# Patient Record
Sex: Female | Born: 1970 | Race: White | Hispanic: No | Marital: Married | Smoking: Never smoker
Health system: Southern US, Community
[De-identification: ages and names within clinical notes are randomized; demographics above are authoritative.]

## PROBLEM LIST (undated history)

## (undated) DIAGNOSIS — G8929 Other chronic pain: Secondary | ICD-10-CM

## (undated) DIAGNOSIS — M549 Dorsalgia, unspecified: Secondary | ICD-10-CM

## (undated) HISTORY — PX: BACK SURGERY: SHX140

## (undated) HISTORY — PX: OTHER SURGICAL HISTORY: SHX169

## (undated) HISTORY — PX: ANKLE FRACTURE SURGERY: SHX122

---

## 2016-05-02 ENCOUNTER — Other Ambulatory Visit (HOSPITAL_COMMUNITY): Payer: Self-pay | Admitting: Nurse Practitioner

## 2016-05-02 DIAGNOSIS — R1011 Right upper quadrant pain: Secondary | ICD-10-CM

## 2016-05-03 ENCOUNTER — Observation Stay (HOSPITAL_COMMUNITY)
Admission: EM | Admit: 2016-05-03 | Discharge: 2016-05-04 | Disposition: A | Discharge status: Home / Self Care | Payer: Managed Care, Other (non HMO) | Attending: Emergency Medicine | Admitting: Emergency Medicine

## 2016-05-03 ENCOUNTER — Encounter (HOSPITAL_COMMUNITY)
Admission: EM | Disposition: A | Discharge status: Home / Self Care | Payer: Self-pay | Source: Home / Self Care | Attending: Emergency Medicine

## 2016-05-03 ENCOUNTER — Observation Stay (HOSPITAL_COMMUNITY): Payer: Managed Care, Other (non HMO)

## 2016-05-03 ENCOUNTER — Observation Stay (HOSPITAL_COMMUNITY): Payer: Managed Care, Other (non HMO) | Admitting: Anesthesiology

## 2016-05-03 ENCOUNTER — Ambulatory Visit (HOSPITAL_COMMUNITY)
Admission: RE | Admit: 2016-05-03 | Discharge: 2016-05-03 | Disposition: A | Discharge status: Home / Self Care | Payer: Managed Care, Other (non HMO) | Source: Ambulatory Visit | Attending: Nurse Practitioner | Admitting: Nurse Practitioner

## 2016-05-03 ENCOUNTER — Encounter (HOSPITAL_COMMUNITY): Payer: Self-pay

## 2016-05-03 DIAGNOSIS — Z880 Allergy status to penicillin: Secondary | ICD-10-CM | POA: Diagnosis not present

## 2016-05-03 DIAGNOSIS — K8000 Calculus of gallbladder with acute cholecystitis without obstruction: Secondary | ICD-10-CM | POA: Diagnosis present

## 2016-05-03 DIAGNOSIS — Z419 Encounter for procedure for purposes other than remedying health state, unspecified: Secondary | ICD-10-CM

## 2016-05-03 DIAGNOSIS — K8012 Calculus of gallbladder with acute and chronic cholecystitis without obstruction: Principal | ICD-10-CM | POA: Insufficient documentation

## 2016-05-03 DIAGNOSIS — R1011 Right upper quadrant pain: Secondary | ICD-10-CM

## 2016-05-03 DIAGNOSIS — G8929 Other chronic pain: Secondary | ICD-10-CM | POA: Insufficient documentation

## 2016-05-03 DIAGNOSIS — M549 Dorsalgia, unspecified: Secondary | ICD-10-CM | POA: Diagnosis not present

## 2016-05-03 DIAGNOSIS — K810 Acute cholecystitis: Secondary | ICD-10-CM | POA: Diagnosis present

## 2016-05-03 HISTORY — DX: Dorsalgia, unspecified: M54.9

## 2016-05-03 HISTORY — PX: CHOLECYSTECTOMY: SHX55

## 2016-05-03 HISTORY — DX: Other chronic pain: G89.29

## 2016-05-03 LAB — I-STAT BETA HCG BLOOD, ED (MC, WL, AP ONLY)

## 2016-05-03 LAB — COMPREHENSIVE METABOLIC PANEL
ALK PHOS: 63 U/L (ref 38–126)
ALT: 14 U/L (ref 14–54)
ANION GAP: 7 (ref 5–15)
AST: 16 U/L (ref 15–41)
Albumin: 3.8 g/dL (ref 3.5–5.0)
BILIRUBIN TOTAL: 0.6 mg/dL (ref 0.3–1.2)
BUN: 12 mg/dL (ref 6–20)
CALCIUM: 8.9 mg/dL (ref 8.9–10.3)
CO2: 26 mmol/L (ref 22–32)
CREATININE: 0.88 mg/dL (ref 0.44–1.00)
Chloride: 105 mmol/L (ref 101–111)
GFR calc non Af Amer: >60 mL/min (ref >60)
Glucose, Bld: 100 mg/dL — ABNORMAL HIGH (ref 65–99)
Potassium: 3.9 mmol/L (ref 3.5–5.1)
SODIUM: 138 mmol/L (ref 135–145)
TOTAL PROTEIN: 7.3 g/dL (ref 6.5–8.1)

## 2016-05-03 LAB — CBC
HCT: 37.3 % (ref 36.0–46.0)
HEMOGLOBIN: 12.7 g/dL (ref 12.0–15.0)
MCH: 32 pg (ref 26.0–34.0)
MCHC: 34 g/dL (ref 30.0–36.0)
MCV: 94 fL (ref 78.0–100.0)
PLATELETS: 349 10*3/uL (ref 150–400)
RBC: 3.97 MIL/uL (ref 3.87–5.11)
RDW: 12.6 % (ref 11.5–15.5)
WBC: 8.8 10*3/uL (ref 4.0–10.5)

## 2016-05-03 LAB — URINALYSIS, ROUTINE W REFLEX MICROSCOPIC
Bilirubin Urine: NEGATIVE
Glucose, UA: NEGATIVE mg/dL
HGB URINE DIPSTICK: NEGATIVE
Ketones, ur: NEGATIVE mg/dL
Leukocytes, UA: NEGATIVE
NITRITE: NEGATIVE
PROTEIN: NEGATIVE mg/dL
Specific Gravity, Urine: 1.027 (ref 1.005–1.030)
pH: 5 (ref 5.0–8.0)

## 2016-05-03 LAB — LIPASE, BLOOD: Lipase: 18 U/L (ref 11–51)

## 2016-05-03 SURGERY — LAPAROSCOPIC CHOLECYSTECTOMY WITH INTRAOPERATIVE CHOLANGIOGRAM
Anesthesia: General

## 2016-05-03 MED ORDER — DIPHENHYDRAMINE HCL 50 MG/ML IJ SOLN: 25.0000 mg | Freq: Four times a day (QID) | INTRAMUSCULAR | Status: DC | PRN

## 2016-05-03 MED ORDER — IOPAMIDOL (ISOVUE-300) INJECTION 61%
INTRAVENOUS | Status: AC
  Filled 2016-05-03: qty 50

## 2016-05-03 MED ORDER — PHENYLEPHRINE 40 MCG/ML (10ML) SYRINGE FOR IV PUSH (FOR BLOOD PRESSURE SUPPORT)
PREFILLED_SYRINGE | INTRAVENOUS | Status: DC | PRN
  Administered 2016-05-03: 80 ug via INTRAVENOUS
  Administered 2016-05-03 (×2): 40 ug via INTRAVENOUS

## 2016-05-03 MED ORDER — CIPROFLOXACIN IN D5W 400 MG/200ML IV SOLN
400.0000 mg | Freq: Two times a day (BID) | INTRAVENOUS | Status: DC
  Administered 2016-05-03: 400 mg via INTRAVENOUS
  Filled 2016-05-03: qty 200

## 2016-05-03 MED ORDER — OXYCODONE HCL 5 MG PO TABS: 5.0000 mg | ORAL_TABLET | Freq: Once | ORAL | Status: DC | PRN

## 2016-05-03 MED ORDER — MORPHINE SULFATE (PF) 4 MG/ML IV SOLN: 1.0000 mg | INTRAVENOUS | Status: DC | PRN

## 2016-05-03 MED ORDER — SUGAMMADEX SODIUM 200 MG/2ML IV SOLN
INTRAVENOUS | Status: AC
  Filled 2016-05-03: qty 2

## 2016-05-03 MED ORDER — HYDROMORPHONE HCL 1 MG/ML IJ SOLN: 1.0000 mg | INTRAMUSCULAR | Status: DC | PRN

## 2016-05-03 MED ORDER — BUPIVACAINE-EPINEPHRINE 0.5% -1:200000 IJ SOLN
INTRAMUSCULAR | Status: DC | PRN
  Administered 2016-05-03: 20 mL

## 2016-05-03 MED ORDER — HYDROMORPHONE HCL 2 MG/ML IJ SOLN
INTRAMUSCULAR | Status: AC
  Filled 2016-05-03: qty 1

## 2016-05-03 MED ORDER — LACTATED RINGERS IR SOLN
Status: DC | PRN
  Administered 2016-05-03: 1000 mL

## 2016-05-03 MED ORDER — KCL IN DEXTROSE-NACL 20-5-0.45 MEQ/L-%-% IV SOLN
INTRAVENOUS | Status: AC
  Filled 2016-05-03: qty 1000

## 2016-05-03 MED ORDER — BUPIVACAINE-EPINEPHRINE (PF) 0.5% -1:200000 IJ SOLN
INTRAMUSCULAR | Status: AC
  Filled 2016-05-03: qty 30

## 2016-05-03 MED ORDER — VENLAFAXINE HCL ER 150 MG PO CP24
150.0000 mg | ORAL_CAPSULE | Freq: Every day | ORAL | Status: DC
  Administered 2016-05-03: 150 mg via ORAL
  Filled 2016-05-03: qty 1

## 2016-05-03 MED ORDER — FENTANYL CITRATE (PF) 250 MCG/5ML IJ SOLN
INTRAMUSCULAR | Status: AC
  Filled 2016-05-03: qty 5

## 2016-05-03 MED ORDER — ONDANSETRON HCL 4 MG/2ML IJ SOLN
INTRAMUSCULAR | Status: AC
  Filled 2016-05-03: qty 2

## 2016-05-03 MED ORDER — SUGAMMADEX SODIUM 200 MG/2ML IV SOLN
INTRAVENOUS | Status: DC | PRN
  Administered 2016-05-03: 200 mg via INTRAVENOUS

## 2016-05-03 MED ORDER — PROPOFOL 10 MG/ML IV BOLUS
INTRAVENOUS | Status: AC
  Filled 2016-05-03: qty 20

## 2016-05-03 MED ORDER — DEXAMETHASONE SODIUM PHOSPHATE 10 MG/ML IJ SOLN
INTRAMUSCULAR | Status: DC | PRN
  Administered 2016-05-03: 10 mg via INTRAVENOUS

## 2016-05-03 MED ORDER — DEXAMETHASONE SODIUM PHOSPHATE 10 MG/ML IJ SOLN
INTRAMUSCULAR | Status: AC
  Filled 2016-05-03: qty 1

## 2016-05-03 MED ORDER — HYDROMORPHONE HCL 1 MG/ML IJ SOLN: 0.2500 mg | INTRAMUSCULAR | Status: DC | PRN

## 2016-05-03 MED ORDER — ROCURONIUM BROMIDE 10 MG/ML (PF) SYRINGE
PREFILLED_SYRINGE | INTRAVENOUS | Status: DC | PRN
  Administered 2016-05-03: 35 mg via INTRAVENOUS
  Administered 2016-05-03: 5 mg via INTRAVENOUS

## 2016-05-03 MED ORDER — ACETAMINOPHEN 325 MG PO TABS: 650.0000 mg | ORAL_TABLET | Freq: Four times a day (QID) | ORAL | Status: DC | PRN

## 2016-05-03 MED ORDER — MIDAZOLAM HCL 2 MG/2ML IJ SOLN
INTRAMUSCULAR | Status: AC
  Filled 2016-05-03: qty 2

## 2016-05-03 MED ORDER — HYDROMORPHONE HCL 1 MG/ML IJ SOLN
INTRAMUSCULAR | Status: DC | PRN
  Administered 2016-05-03: 1 mg via INTRAVENOUS
  Administered 2016-05-03 (×2): 0.5 mg via INTRAVENOUS

## 2016-05-03 MED ORDER — MIDAZOLAM HCL 5 MG/5ML IJ SOLN
INTRAMUSCULAR | Status: DC | PRN
  Administered 2016-05-03: 2 mg via INTRAVENOUS

## 2016-05-03 MED ORDER — ACETAMINOPHEN 650 MG RE SUPP: 650.0000 mg | Freq: Four times a day (QID) | RECTAL | Status: DC | PRN

## 2016-05-03 MED ORDER — DIPHENHYDRAMINE HCL 25 MG PO CAPS: 25.0000 mg | ORAL_CAPSULE | Freq: Four times a day (QID) | ORAL | Status: DC | PRN

## 2016-05-03 MED ORDER — FENTANYL CITRATE (PF) 100 MCG/2ML IJ SOLN
INTRAMUSCULAR | Status: DC | PRN
  Administered 2016-05-03: 50 ug via INTRAVENOUS
  Administered 2016-05-03: 100 ug via INTRAVENOUS
  Administered 2016-05-03 (×2): 50 ug via INTRAVENOUS

## 2016-05-03 MED ORDER — HYDRALAZINE HCL 20 MG/ML IJ SOLN: 10.0000 mg | INTRAMUSCULAR | Status: DC | PRN

## 2016-05-03 MED ORDER — HYDROCODONE-ACETAMINOPHEN 5-325 MG PO TABS: 1.0000 | ORAL_TABLET | ORAL | Status: DC | PRN

## 2016-05-03 MED ORDER — ROCURONIUM BROMIDE 50 MG/5ML IV SOSY
PREFILLED_SYRINGE | INTRAVENOUS | Status: AC
  Filled 2016-05-03: qty 5

## 2016-05-03 MED ORDER — LIDOCAINE 2% (20 MG/ML) 5 ML SYRINGE
INTRAMUSCULAR | Status: DC | PRN
  Administered 2016-05-03: 50 mg via INTRAVENOUS

## 2016-05-03 MED ORDER — ENOXAPARIN SODIUM 40 MG/0.4ML ~~LOC~~ SOLN: 40.0000 mg | SUBCUTANEOUS | Status: DC

## 2016-05-03 MED ORDER — OXYCODONE HCL 5 MG/5ML PO SOLN: 5.0000 mg | Freq: Once | ORAL | Status: DC | PRN

## 2016-05-03 MED ORDER — KCL IN DEXTROSE-NACL 20-5-0.45 MEQ/L-%-% IV SOLN
INTRAVENOUS | Status: DC
  Administered 2016-05-03 (×2): via INTRAVENOUS

## 2016-05-03 MED ORDER — LIDOCAINE 2% (20 MG/ML) 5 ML SYRINGE
INTRAMUSCULAR | Status: AC
  Filled 2016-05-03: qty 5

## 2016-05-03 MED ORDER — ONDANSETRON HCL 4 MG/2ML IJ SOLN: 4.0000 mg | Freq: Four times a day (QID) | INTRAMUSCULAR | Status: DC | PRN

## 2016-05-03 MED ORDER — ONDANSETRON 4 MG PO TBDP: 4.0000 mg | ORAL_TABLET | Freq: Four times a day (QID) | ORAL | Status: DC | PRN

## 2016-05-03 MED ORDER — PROPOFOL 10 MG/ML IV BOLUS
INTRAVENOUS | Status: DC | PRN
  Administered 2016-05-03: 200 mg via INTRAVENOUS

## 2016-05-03 MED ORDER — SODIUM CHLORIDE 0.9 % IV SOLN
INTRAVENOUS | Status: DC
  Administered 2016-05-03: 15:00:00 via INTRAVENOUS

## 2016-05-03 MED ORDER — IOPAMIDOL (ISOVUE-300) INJECTION 61%
INTRAVENOUS | Status: DC | PRN
  Administered 2016-05-03: 10 mL

## 2016-05-03 MED ORDER — ONDANSETRON HCL 4 MG/2ML IJ SOLN
4.0000 mg | Freq: Four times a day (QID) | INTRAMUSCULAR | Status: DC | PRN
  Administered 2016-05-03: 4 mg via INTRAVENOUS

## 2016-05-03 MED ORDER — SUCCINYLCHOLINE CHLORIDE 200 MG/10ML IV SOSY
PREFILLED_SYRINGE | INTRAVENOUS | Status: AC
  Filled 2016-05-03: qty 10

## 2016-05-03 SURGICAL SUPPLY — 32 items
APPLIER CLIP ROT 10 11.4 M/L (STAPLE) ×3
CABLE HIGH FREQUENCY MONO STRZ (ELECTRODE) ×3 IMPLANT
CHLORAPREP W/TINT 26ML (MISCELLANEOUS) ×6 IMPLANT
CLIP APPLIE ROT 10 11.4 M/L (STAPLE) ×1 IMPLANT
CLOSURE WOUND 1/2 X4 (GAUZE/BANDAGES/DRESSINGS) ×1
COVER MAYO STAND STRL (DRAPES) ×3 IMPLANT
COVER SURGICAL LIGHT HANDLE (MISCELLANEOUS) ×3 IMPLANT
DECANTER SPIKE VIAL GLASS SM (MISCELLANEOUS) ×3 IMPLANT
DRAPE C-ARM 42X120 X-RAY (DRAPES) ×3 IMPLANT
ELECT REM PT RETURN 9FT ADLT (ELECTROSURGICAL) ×3
ELECTRODE REM PT RTRN 9FT ADLT (ELECTROSURGICAL) ×1 IMPLANT
GAUZE SPONGE 2X2 8PLY STRL LF (GAUZE/BANDAGES/DRESSINGS) ×1 IMPLANT
GLOVE SURG ORTHO 8.0 STRL STRW (GLOVE) ×3 IMPLANT
GOWN STRL REUS W/TWL XL LVL3 (GOWN DISPOSABLE) ×6 IMPLANT
HEMOSTAT SURGICEL 4X8 (HEMOSTASIS) IMPLANT
IRRIG SUCT STRYKERFLOW 2 WTIP (MISCELLANEOUS) ×3
IRRIGATION SUCT STRKRFLW 2 WTP (MISCELLANEOUS) ×1 IMPLANT
KIT BASIN OR (CUSTOM PROCEDURE TRAY) ×3 IMPLANT
POUCH SPECIMEN RETRIEVAL 10MM (ENDOMECHANICALS) ×3 IMPLANT
SCISSORS LAP 5X35 DISP (ENDOMECHANICALS) ×3 IMPLANT
SET CHOLANGIOGRAPH MIX (MISCELLANEOUS) ×3 IMPLANT
SLEEVE XCEL OPT CAN 5 100 (ENDOMECHANICALS) ×3 IMPLANT
SPONGE GAUZE 2X2 STER 10/PKG (GAUZE/BANDAGES/DRESSINGS) ×2
STRIP CLOSURE SKIN 1/2X4 (GAUZE/BANDAGES/DRESSINGS) ×2 IMPLANT
SUT MNCRL AB 4-0 PS2 18 (SUTURE) ×3 IMPLANT
TOWEL OR 17X26 10 PK STRL BLUE (TOWEL DISPOSABLE) ×3 IMPLANT
TOWEL OR NON WOVEN STRL DISP B (DISPOSABLE) ×3 IMPLANT
TRAY LAPAROSCOPIC (CUSTOM PROCEDURE TRAY) ×3 IMPLANT
TROCAR BLADELESS OPT 5 100 (ENDOMECHANICALS) ×3 IMPLANT
TROCAR XCEL BLUNT TIP 100MML (ENDOMECHANICALS) ×3 IMPLANT
TROCAR XCEL NON-BLD 11X100MML (ENDOMECHANICALS) ×3 IMPLANT
TUBING INSUF HEATED (TUBING) IMPLANT

## 2016-05-03 NOTE — Anesthesia Procedure Notes (Signed)

## 2016-05-03 NOTE — Anesthesia Preprocedure Evaluation (Signed)
Anesthesia Evaluation  Patient identified by MRN, date of birth, ID band Patient awake    Reviewed: Allergy & Precautions, H&P , NPO status , Patient's Chart, lab work & pertinent test results  Airway Mallampati: II   Neck ROM: full    Dental   Pulmonary neg pulmonary ROS,    breath sounds clear to auscultation       Cardiovascular negative cardio ROS   Rhythm:regular Rate:Normal     Neuro/Psych    GI/Hepatic   Endo/Other  Morbid obesity  Renal/GU      Musculoskeletal   Abdominal   Peds  Hematology   Anesthesia Other Findings   Reproductive/Obstetrics                             Anesthesia Physical Anesthesia Plan  ASA: II  Anesthesia Plan: General   Post-op Pain Management:    Induction: Intravenous  Airway Management Planned: Oral ETT  Additional Equipment:   Intra-op Plan:   Post-operative Plan: Extubation in OR  Informed Consent: I have reviewed the patients History and Physical, chart, labs and discussed the procedure including the risks, benefits and alternatives for the proposed anesthesia with the patient or authorized representative who has indicated his/her understanding and acceptance.     Plan Discussed with: CRNA, Anesthesiologist and Surgeon  Anesthesia Plan Comments:         Anesthesia Quick Evaluation

## 2016-05-03 NOTE — Transfer of Care (Signed)
Immediate Anesthesia Transfer of Care Note  Patient: Veronica SabinsLaura Glaza  Procedure(s) Performed: Procedure(s): LAPAROSCOPIC CHOLECYSTECTOMY WITH INTRAOPERATIVE CHOLANGIOGRAM (N/A)  Patient Location: PACU  Anesthesia Type:General  Level of Consciousness:  sedated, patient cooperative and responds to stimulation  Airway & Oxygen Therapy:Patient Spontanous Breathing and Patient connected to face mask oxgen  Post-op Assessment:  Report given to PACU RN and Post -op Vital signs reviewed and stable  Post vital signs:  Reviewed and stable  Last Vitals:  Vitals:   05/03/16 0827 05/03/16 1105  BP: 130/96 116/76  Pulse: 88 73  Resp: 18 16  Temp: 36.9 C     Complications: No apparent anesthesia complications

## 2016-05-03 NOTE — ED Notes (Signed)
PATIENT BELONGINGS IN LOCKER 38 IN TCU. WALLET/CELL PHONE WITH SECURITY.

## 2016-05-03 NOTE — ED Triage Notes (Signed)
Patient reports that she was notified by physician that she had an ultrasound of the gallbladder this AM. Patient has report of the US and states that she has an obstructing gallstone.

## 2016-05-03 NOTE — Discharge Instructions (Signed)
°LAPAROSCOPIC SURGERY: POST OP INSTRUCTIONS  °1. DIET: Follow a light bland diet the first 24 hours after arrival home, such as soup, liquids, crackers, etc. Be sure to include lots of fluids daily. Avoid fast food or heavy meals as your are more likely to get nauseated. Eat a low fat the next few days after surgery.  °2. Take your usually prescribed home medications unless otherwise directed. °3. PAIN CONTROL:  °1. Pain is best controlled by a usual combination of three different methods TOGETHER:  °1. Ice/Heat °2. Over the counter pain medication °3. Prescription pain medication °2. Most patients will experience some swelling and bruising around the incisions. Ice packs or heating pads (30-60 minutes up to 6 times a day) will help. Use ice for the first few days to help decrease swelling and bruising, then switch to heat to help relax tight/sore spots and speed recovery. Some people prefer to use ice alone, heat alone, alternating between ice & heat. Experiment to what works for you. Swelling and bruising can take several weeks to resolve.  °3. It is helpful to take an over-the-counter pain medication regularly for the first few weeks. Choose one of the following that works best for you:  °1. Naproxen (Aleve, etc) Two 220mg tabs twice a day °2. Ibuprofen (Advil, etc) Three 200mg tabs four times a day (every meal & bedtime) °3. Acetaminophen (Tylenol, etc) 500-650mg four times a day (every meal & bedtime) °4. A prescription for pain medication (such as oxycodone, hydrocodone, etc) should be given to you upon discharge. Take your pain medication as prescribed.  °1. If you are having problems/concerns with the prescription medicine (does not control pain, nausea, vomiting, rash, itching, etc), please call us [phone] to see if we need to switch you to a different pain medicine that will work better for you and/or control your side effect better. °2. If you need a refill on your pain medication, please contact  your pharmacy. They will contact our office to request authorization. Prescriptions will not be filled after 5 pm or on week-ends. °4. Avoid getting constipated. Between the surgery and the pain medications, it is common to experience some constipation. Increasing fluid intake and taking a fiber supplement (such as Metamucil, Citrucel, FiberCon, MiraLax, etc) 1-2 times a day regularly will usually help prevent this problem from occurring. A mild laxative (prune juice, Milk of Magnesia, MiraLax, etc) should be taken according to package directions if there are no bowel movements after 48 hours.  °5. Watch out for diarrhea. If you have many loose bowel movements, simplify your diet to bland foods & liquids for a few days. Stop any stool softeners and decrease your fiber supplement. Switching to mild anti-diarrheal medications (Kayopectate, Pepto Bismol) can help. If this worsens or does not improve, please call us. °6. Wash / shower every day. You may shower over the dressings as they are waterproof. Continue to shower over incision(s) after the dressing is off. If there is glue over the incisions try not to pick it off, let it fall off naturally. °7. Remove your waterproof bandages 2 days after surgery. You may leave the incision open to air. You may replace a dressing/Band-Aid to cover the incision for comfort if you wish.  °8. ACTIVITIES as tolerated:  °1. You may resume regular (light) daily activities beginning the next day--such as daily self-care, walking, climbing stairs--gradually increasing activities as tolerated. If you can walk 30 minutes without difficulty, it is safe to try more intense activity   such as jogging, treadmill, bicycling, low-impact aerobics, swimming, etc. °2. Save the most intensive and strenuous activity for last such as sit-ups, heavy lifting, contact sports, etc Refrain from any heavy lifting or straining until you are off narcotics for pain control. For the first 2-3 weeks do not lift  over 10-15lb.  °3. DO NOT PUSH THROUGH PAIN. Let pain be your guide: If it hurts to do something, don't do it. Pain is your body warning you to avoid that activity for another week until the pain goes down. °4. You may drive when you are no longer taking prescription pain medication, you can comfortably wear a seatbelt, and you can safely maneuver your car and apply brakes. °5. You may have sexual intercourse when it is comfortable.  °9. FOLLOW UP in our office  °1. Please call [facility] at [phone] to set up an appointment to see your surgeon in the office for a follow-up appointment approximately 2-3 weeks after your surgery. °2. Make sure that you call for this appointment the day you arrive home to insure a convenient appointment time. °     10. IF YOU HAVE DISABILITY OR FAMILY LEAVE FORMS, BRING THEM TO THE               OFFICE FOR PROCESSING.  ° °WHEN TO CALL US [phone]:  °1. Poor pain control °2. Reactions / problems with new medications (rash/itching, nausea, etc)  °3. Fever over 101.5 F (38.5 C) °4. Inability to urinate °5. Nausea and/or vomiting °6. Worsening swelling or bruising °7. Continued bleeding from incision. °8. Increased pain, redness, or drainage from the incision ° °The clinic staff is available to answer your questions during regular business hours (8:30am-5pm). Please don’t hesitate to call and ask to speak to one of our nurses for clinical concerns.  °If you have a medical emergency, go to the nearest emergency room or call 911.  °A surgeon from [facility] is always on call at the hospitals  ° °[facility]  °[address], [address], [place], [place] [address] ?  °MAIN: [phone] ? TOLL FREE: [phone] ?  °FAX [phone]  °[email or web address] ° ° °

## 2016-05-03 NOTE — ED Provider Notes (Signed)
[facility]-EMERGENCY DEPT Provider Note   CSN: [id number] Arrival date & time: 05/03/16  [id number]     History   Chief Complaint No chief complaint on file.   HPI Veronica Scott is a 46 y.o. female.  HPI Patient reports that she has no active medical problems. He reports she ate some "horrible food" Sunday night. He reports that she woke up in the early hours of the morning with severe pain in her right upper quadrant and epigastrium. She states that she vomited much of the day on Monday. She reports by Tuesday the symptoms seemed to be improving. She did go to her doctor to make sure that everything was okay. Her doctor referred her to get an ultrasound of her gallbladder. Ultrasound showed an obstructing gallstone and she was referred to the emergency department. Patient reports she has not vomited for 2 days now. He reports the pain has abated significantly compared to its onset. He reports now she just has some mild discomfort in the right upper quadrant. She has not developed fever, recurrence of vomiting or diarrhea. Past Medical History:  Diagnosis Date  . Chronic back pain     There are no active problems to display for this patient.   Past Surgical History:  Procedure Laterality Date  . ANKLE FRACTURE SURGERY    . BACK SURGERY    . perforated septum surgery      OB History    No data available       Home Medications    Prior to Admission medications   Medication Sig Start Date End Date Taking? Authorizing Provider  venlafaxine XR (EFFEXOR-XR) 150 MG 24 hr capsule Take 150 mg by mouth daily. 04/04/16  Yes Historical Provider, MD    Family History History reviewed. No pertinent family history.  Social History Social History  Substance Use Topics  . Smoking status: Never Smoker  . Smokeless tobacco: Never Used  . Alcohol use No     Allergies   Penicillins   Review of Systems Review of Systems 10 Systems reviewed and are negative for acute change except as noted  in the HPI.   Physical Exam Updated Vital Signs BP 116/76 (BP Location: Left Arm)   Pulse 73   Temp 98.4 F (36.9 C) (Oral)   Resp 16   Ht 5' 4.5" (1.638 m)   Wt 230 lb (104.3 kg)   LMP 04/05/2016 (Approximate)   SpO2 95%   BMI 38.87 kg/m   Physical Exam  Constitutional: She appears well-developed and well-nourished. No distress.  HENT:  Head: Normocephalic and atraumatic.  Eyes: Conjunctivae are normal.  Neck: Neck supple.  Cardiovascular: Normal rate and regular rhythm.   No murmur heard. Pulmonary/Chest: Effort normal and breath sounds normal. No respiratory distress.  Abdominal: Soft. She exhibits no distension. There is tenderness. There is no guarding.  Mild right upper quadrant tenderness without guarding. Lower abdomen nontender.  Musculoskeletal: She exhibits no edema or tenderness.  Neurological: She is alert.  Skin: Skin is warm and dry.  Psychiatric: She has a normal mood and affect.  Nursing note and vitals reviewed.     ED Treatments / Results  Labs (all labs ordered are listed, but only abnormal results are displayed) Labs Reviewed  COMPREHENSIVE METABOLIC PANEL - Abnormal; Notable for the following:       Result Value   Glucose, Bld 100 (*)    All other components within normal limits  URINALYSIS, ROUTINE W REFLEX MICROSCOPIC - Abnormal; Notable for  the following:    APPearance HAZY (*)    All other components within normal limits  LIPASE, BLOOD  CBC  I-STAT BETA HCG BLOOD, ED ([facility], [facility], [facility] ONLY)    EKG  EKG Interpretation None       Radiology US Abdomen Limited  Result Date: 05/03/2016 CLINICAL DATA:  Right upper quadrant pain and vomiting for 4 days. EXAM: US ABDOMEN LIMITED - RIGHT UPPER QUADRANT COMPARISON:  None. FINDINGS: Gallbladder: A stone impacted in the gallbladder neck measuring 2.0 cm is identified. A second mobile stone measuring 2.6 cm is also seen. There is some sludge in the gallbladder. The gallbladder wall is thickened at  0.5 cm. Trace amount of pericholecystic fluid is identified. Common bile duct: Diameter: 0.3 cm Liver: No focal lesion identified. Within normal limits in parenchymal echogenicity. IMPRESSION: Findings most consistent with acute cholecystitis as described above. These results will be called to the ordering clinician or representative by the Radiologist Assistant, and communication documented in the PACS or zVision Dashboard. Electronically Signed   By: Drusilla Kanner M.D.   On: 05/03/2016 07:46    Procedures Procedures (including critical care time)  Medications Ordered in ED Medications - No data to display   Initial Impression / Assessment and Plan / ED Course  I have reviewed the triage vital signs and the nursing notes.  Pertinent labs & imaging results that were available during my care of the patient were reviewed by me and considered in my medical decision making (see chart for details).     Consult: Surgery has evaluated the patient in the emergency department. Plan will be for surgical intervention for cholelithiasis Final Clinical Impressions(s) / ED Diagnoses   Final diagnoses:  Calculus of gallbladder with acute cholecystitis without obstruction   Patient is alert and nontoxic. She has findings consistent with gallbladder disease with stones and ultrasound shown cholecystitis. LFTs are currently normal. She does not have other medical conditions. New Prescriptions New Prescriptions   No medications on file     Arby Barrette, MD 05/03/16 1354

## 2016-05-03 NOTE — Op Note (Signed)
Procedure Note  Pre-operative Diagnosis:  Acute cholecystitis, cholelithiasis  Post-operative Diagnosis:  same  Surgeon:  Veronica Hecklerodd M. Ellina Sivertsen, MD, FACS  Assistant:  none   Procedure:  Laparoscopic cholecystectomy with intra-operative cholangiography  Anesthesia:  General  Estimated Blood Loss:  minimal  Drains: none         Specimen: Gallbladder to pathology  Indications:  Patient is a 46 yo WF who presents to the ER with abd pain and acute cholecystitis.  Procedure Details:  The patient was seen in the pre-op holding area. The risks, benefits, complications, treatment options, and expected outcomes were previously discussed with the patient. The patient agreed with the proposed plan and has signed the informed consent form.  The patient was brought to the Operating Room, identified as Veronica Scott and the procedure verified as laparoscopic cholecystectomy with intraoperative cholangiography. A "time out" was completed and the above information confirmed.  The patient was placed in the supine position. Following induction of general anesthesia, the abdomen was prepped and draped in the usual aseptic fashion.  An incision was made in the skin near the umbilicus. The midline fascia was incised and the peritoneal cavity was entered and a Hasson canula was introduced under direct vision.  The Hasson canula was secured with a 0-Vicryl pursestring suture. Pneumoperitoneum was established with carbon dioxide. Additional trocars were introduced under direct vision along the right costal margin in the midline, mid-clavicular line, and anterior axillary line.   The gallbladder was identified and the fundus grasped and retracted cephalad. Adhesions were taken down bluntly and the electrocautery was utilized as needed, taking care not to injure any adjacent structures. The infundibulum was grasped and retracted laterally, exposing the peritoneum overlying the triangle of Calot. The peritoneum was  incised and structures exposed with blunt dissection. The cystic duct was clearly identified, bluntly dissected circumferentially, and clipped at the neck of the gallbladder.  An incision was made in the cystic duct and the cholangiogram catheter introduced. The catheter was secured using an ligaclip.  Real-time cholangiography was performed using C-arm fluoroscopy.  There was rapid filling of a normal caliber common bile duct.  There was reflux of contrast into the left and right hepatic ductal systems.  There was free flow distally into the duodenum without filling defect or obstruction.  The catheter was removed from the peritoneal cavity.  The cystic duct was then ligated with surgical clips and divided. The cystic artery was identified, dissected circumferentially, ligated with ligaclips, and divided.  The gallbladder was dissected away from the liver bed using the electrocautery for hemostasis. The gallbladder was completely removed from the liver and placed into an endocatch bag. The gallbladder was removed in the endocatch bag through the umbilical port site and submitted to pathology for review.  The right upper quadrant was irrigated and the gallbladder bed was inspected. Hemostasis was achieved with the electrocautery.  Pneumoperitoneum was released after viewing removal of the trocars with good hemostasis noted. The umbilical wound was irrigated and the fascia was then closed with the pursestring suture.  Local anesthetic was infiltrated at all port sites. The skin incisions were closed with 4-0 Monocril subcuticular sutures and steri-strips and dressings were applied.  Instrument, sponge, and needle counts were correct at the conclusion of the case.  The patient was awakened from anesthesia and brought to the recovery room in stable condition.  The patient tolerated the procedure well.   Veronica Hecklerodd M. Tinia Oravec, MD, [facility] Office: [phone]

## 2016-05-03 NOTE — Anesthesia Postprocedure Evaluation (Addendum)
Anesthesia Post Note  Patient: Veronica Scott  Procedure(s) Performed: Procedure(s) (LRB): LAPAROSCOPIC CHOLECYSTECTOMY WITH INTRAOPERATIVE CHOLANGIOGRAM (N/A)  Patient location during evaluation: PACU Anesthesia Type: General Level of consciousness: awake and alert and patient cooperative Pain management: pain level controlled Vital Signs Assessment: post-procedure vital signs reviewed and stable Respiratory status: spontaneous breathing and respiratory function stable Cardiovascular status: stable Anesthetic complications: no       Last Vitals:  Vitals:   05/03/16 1737 05/03/16 1751  BP: 127/81 118/68  Pulse:  84  Resp: 16 18  Temp: 36.7 C 36.8 C    Last Pain:  Vitals:   05/03/16 1737  TempSrc:   PainSc: 2                  Ivi Griffith S

## 2016-05-03 NOTE — H&P (Signed)
[facility] Admission Note  Sonali Wivell 1971/01/31  [id number].    Requesting MD: Johnney Killian Chief Complaint/Reason for Consult: Cholecystitis  HPI:  Veronica Scott is a 46yo female who presented to [facility] earlier today at the recommendation of her PCP. States that she ate some "horrible food" on Sunday and woke up early on Monday with severe RUQ/epigastric abdominal pain. She had associated nausea and vomiting. Symptoms improved by Tuesday night. Went to PCP who ordered a RUQ u/s. This was performed this morning and due to the results she was advised to go to the ED. Prior to this episode she had never had pain like this before.  Hospital workup: - u/s shows sludge, gallbladder wall thickening, trace pericholecystic fluid, and a stone impacted in the gallbladder neck measuring 2.0 cm - WBC and LFTs WNL  Currently her pain is significantly improved. Abdomen sore RUQ, but not painful. Denies n/v, fever, chills, melena, hematochezia, or dysuria.   Last meal was yesterday.  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   HENT: Negative.   Eyes: Negative.   Respiratory: Negative.   Cardiovascular: Negative.   Gastrointestinal: Positive for abdominal pain, nausea and vomiting. Negative for blood in stool, constipation, diarrhea, heartburn and melena.  Genitourinary: Negative.   Musculoskeletal: Negative.   Skin: Negative.   Neurological: Negative.   All systems reviewed and otherwise negative except for as above  History reviewed. No pertinent family history.  Past Medical History:  Diagnosis Date  . Chronic back pain     Past Surgical History:  Procedure Laterality Date  . ANKLE FRACTURE SURGERY    . BACK SURGERY    . perforated septum surgery      Social History:  reports that she has never smoked. She has never used  smokeless tobacco. She reports that she does not drink alcohol or use drugs.  Allergies:  Allergies  Allergen Reactions  . Penicillins Swelling    Has patient had a PCN reaction causing immediate rash, facial/tongue/throat swelling, SOB or lightheadedness with hypotension: No Has patient had a PCN reaction causing severe rash involving mucus membranes or skin necrosis: No Has patient had a PCN reaction that required hospitalization No Has patient had a PCN reaction occurring within the last 10 years: No If all of the above answers are "NO", then may proceed with Cephalosporin use.     (Not in a hospital admission)  Prior to Admission medications   Medication Sig Start Date End Date Taking? Authorizing Provider  venlafaxine XR (EFFEXOR-XR) 150 MG 24 hr capsule Take 150 mg by mouth daily. 04/04/16  Yes Historical Provider, MD    Blood pressure 116/76, pulse 73, temperature 98.4 F (36.9 C), temperature source Oral, resp. rate 16, height 5' 4.5" (1.638 m), weight 230 lb (104.3 kg), last menstrual period 04/05/2016, SpO2 95 %. Physical Exam: General: pleasant, WD/WN white female who is laying in bed in NAD HEENT: head is normocephalic, atraumatic.  Sclera are noninjected.  Mouth is dry Heart: regular, rate, and rhythm.  No obvious murmurs, gallops, or rubs noted.  Palpable pedal pulses bilaterally Lungs: CTAB, no wheezes, rhonchi, or rales noted.  Respiratory effort nonlabored Abd: obese, soft, ND, +BS, no masses, hernias, or organomegaly. Mild RUQ tenderness MS: all 4 extremities are symmetrical with no cyanosis, clubbing, or edema. Skin: warm and dry with no masses, lesions, or rashes Psych: A&Ox3 with an  appropriate affect. Neuro: CM 2-12 intact, extremity CSM intact bilaterally, normal speech  Results for orders placed or performed during the hospital encounter of 05/03/16 (from the past 48 hour(s))  Lipase, blood     Status: None   Collection Time: 05/03/16  9:58 AM  Result Value  Ref Range   Lipase 18 11 - 51 U/L  Comprehensive metabolic panel     Status: Abnormal   Collection Time: 05/03/16  9:58 AM  Result Value Ref Range   Sodium 138 135 - 145 mmol/L   Potassium 3.9 3.5 - 5.1 mmol/L   Chloride 105 101 - 111 mmol/L   CO2 26 22 - 32 mmol/L   Glucose, Bld 100 (H) 65 - 99 mg/dL   BUN 12 6 - 20 mg/dL   Creatinine, Ser 0.88 0.44 - 1.00 mg/dL   Calcium 8.9 8.9 - 10.3 mg/dL   Total Protein 7.3 6.5 - 8.1 g/dL   Albumin 3.8 3.5 - 5.0 g/dL   AST 16 15 - 41 U/L   ALT 14 14 - 54 U/L   Alkaline Phosphatase 63 38 - 126 U/L   Total Bilirubin 0.6 0.3 - 1.2 mg/dL   GFR calc non Af Amer >60 >60 mL/min   GFR calc Af Amer >60 >60 mL/min    Comment: (NOTE) The eGFR has been calculated using the CKD EPI equation. This calculation has not been validated in all clinical situations. eGFR's persistently <60 mL/min signify possible Chronic Kidney Disease.    Anion gap 7 5 - 15  CBC     Status: None   Collection Time: 05/03/16  9:58 AM  Result Value Ref Range   WBC 8.8 4.0 - 10.5 K/uL   RBC 3.97 3.87 - 5.11 MIL/uL   Hemoglobin 12.7 12.0 - 15.0 g/dL   HCT 37.3 36.0 - 46.0 %   MCV 94.0 78.0 - 100.0 fL   MCH 32.0 26.0 - 34.0 pg   MCHC 34.0 30.0 - 36.0 g/dL   RDW 12.6 11.5 - 15.5 %   Platelets 349 150 - 400 K/uL  I-Stat beta hCG blood, ED     Status: None   Collection Time: 05/03/16 10:08 AM  Result Value Ref Range   I-stat hCG, quantitative <5.0 <5 mIU/mL   Comment 3            Comment:   GEST. AGE      CONC.  (mIU/mL)   <=1 WEEK        5 - 50     2 WEEKS       50 - 500     3 WEEKS       100 - 10,000     4 WEEKS     1,000 - 30,000        FEMALE AND NON-PREGNANT FEMALE:     LESS THAN 5 mIU/mL    US Abdomen Limited  Result Date: 05/03/2016 CLINICAL DATA:  Right upper quadrant pain and vomiting for 4 days. EXAM: US ABDOMEN LIMITED - RIGHT UPPER QUADRANT COMPARISON:  None. FINDINGS: Gallbladder: A stone impacted in the gallbladder neck measuring 2.0 cm is identified. A  second mobile stone measuring 2.6 cm is also seen. There is some sludge in the gallbladder. The gallbladder wall is thickened at 0.5 cm. Trace amount of pericholecystic fluid is identified. Common bile duct: Diameter: 0.3 cm Liver: No focal lesion identified. Within normal limits in parenchymal echogenicity. IMPRESSION: Findings most consistent with acute cholecystitis as described above.  These results will be called to the ordering clinician or representative by the Radiologist Assistant, and communication documented in the PACS or zVision Dashboard. Electronically Signed   By: Inge Rise M.D.   On: 05/03/2016 07:46    Anti-infectives    None       Assessment/Plan Acute cholecystitis  - no h/o prior abdominal surgery - RUQ/epigastrium pain x2 days earlier this week, now improved - u/s shows sludge, gallbladder wall thickening, trace pericholecystic fluid, and a stone impacted in the gallbladder neck measuring 2.0 cm - WBC and LFTs WNL  Chronic back pain  ID - cipro VTE - SCDs FEN - IVF, NPO  Plan - Admit to med-surg. Plan for OR today for lap chole. NPO for procedure. Start on cipro due to PCN allergy.  Jerrye Beavers, [facility] 05/03/2016, 12:43 PM Pager: [phone] Consults: [phone] Mon-Fri 7:00 am-4:30 pm Sat-Sun 7:00 am-11:30 am

## 2016-05-04 ENCOUNTER — Encounter (HOSPITAL_COMMUNITY): Payer: Self-pay | Admitting: Surgery

## 2016-05-04 MED ORDER — HYDROCODONE-ACETAMINOPHEN 5-325 MG PO TABS: 1.0000 | ORAL_TABLET | Freq: Four times a day (QID) | ORAL | 0 refills | Status: AC | PRN

## 2016-05-04 NOTE — Progress Notes (Signed)
Pt tolerated her breakfast with no complaints of nausea. Ambulating independently.  D/C instructions were given and understanding was verbalized.  A prescription was given.

## 2016-05-04 NOTE — Discharge Summary (Signed)
[facility] Discharge Summary   Patient ID: Veronica Scott MRN: [id number] DOB/AGE: [birth year] 46 y.o.  Admit date: 05/03/2016 Discharge date: 05/04/2016  Admitting Diagnosis: Acute cholecystitis  Discharge Diagnosis Patient Active Problem List   Diagnosis Date Noted  . Acute cholecystitis 05/03/2016    Consultants None  Imaging: Dg Cholangiogram Operative  Result Date: 05/03/2016 CLINICAL DATA:  Right upper quadrant pain EXAM: INTRAOPERATIVE CHOLANGIOGRAM TECHNIQUE: Cholangiographic images from the C-arm fluoroscopic device were submitted for interpretation post-operatively. Please see the procedural report for the amount of contrast and the fluoroscopy time utilized. COMPARISON:  None. FINDINGS: Contrast fills the biliary tree and duodenum without filling defects in the common bile duct. IMPRESSION: Patent biliary tree. Electronically Signed   By: Jolaine Click M.D.   On: 05/03/2016 16:10   US Abdomen Limited  Result Date: 05/03/2016 CLINICAL DATA:  Right upper quadrant pain and vomiting for 4 days. EXAM: US ABDOMEN LIMITED - RIGHT UPPER QUADRANT COMPARISON:  None. FINDINGS: Gallbladder: A stone impacted in the gallbladder neck measuring 2.0 cm is identified. A second mobile stone measuring 2.6 cm is also seen. There is some sludge in the gallbladder. The gallbladder wall is thickened at 0.5 cm. Trace amount of pericholecystic fluid is identified. Common bile duct: Diameter: 0.3 cm Liver: No focal lesion identified. Within normal limits in parenchymal echogenicity. IMPRESSION: Findings most consistent with acute cholecystitis as described above. These results will be called to the ordering clinician or representative by the Radiologist Assistant, and communication documented in the PACS or zVision Dashboard. Electronically Signed   By: Drusilla Kanner M.D.   On: 05/03/2016 07:46    Procedures Dr. Gerrit Friends (05/03/16) - Laparoscopic Cholecystectomy with [facility]  Hospital Course:   Veronica Scott is a 46yo female who presented to [facility] 05/03/16 with RUQ abdominal pain that started 4 days prior, but had improved. Seen by PCP who sent her for an u/s. This showed cholelithiasis with acute cholecystitis. Patient was recommended to proceed to ED. Patient was admitted and underwent procedure listed above.  Tolerated procedure well and was transferred to the floor.  Diet was advanced as tolerated.  On POD1 the patient was voiding well, tolerating diet, ambulating well, pain well controlled, vital signs stable, incisions c/d/i and felt stable for discharge home.  Patient will follow up in our office in 3 weeks and knows to call with questions or concerns.    I have personally reviewed the patients medication history on the [place] controlled substance database.   Physical Exam: General:  Alert, NAD, pleasant, comfortable Pulm: effort normal Abd:  Soft, ND, appropriately tender, multiple lap covered with clean/dry dressings  Allergies as of 05/04/2016      Reactions   Penicillins Swelling   Has patient had a PCN reaction causing immediate rash, facial/tongue/throat swelling, SOB or lightheadedness with hypotension: No Has patient had a PCN reaction causing severe rash involving mucus membranes or skin necrosis: No Has patient had a PCN reaction that required hospitalization No Has patient had a PCN reaction occurring within the last 10 years: No If all of the above answers are "NO", then may proceed with Cephalosporin use.      Medication List    TAKE these medications   HYDROcodone-acetaminophen 5-325 MG tablet Commonly known as:  NORCO/VICODIN Take 1-2 tablets by mouth every 6 (six) hours as needed for moderate pain.   venlafaxine XR 150 MG 24 hr capsule Commonly known as:  EFFEXOR-XR Take 150 mg by mouth daily.  Follow-up Information    Velora HecklerGERKIN,TODD M, MD. Go on 05/23/2016.   Specialty:  General Surgery Why:  Your appointment is 05/23/16 with Dr. Gerrit FriendsGerkin at 11:15AM.  Please arrive 30 minutes prior to your appointment to check in and fill out necessary paperwork. Contact information: [address] [place] [address] [phone]           Signed: Edson SnowballBROOKE A MILLER, [facility] 05/04/2016, 8:45 AM Pager: [phone] Consults: [phone] Mon-Fri 7:00 am-4:30 pm Sat-Sun 7:00 am-11:30 am

## 2016-10-10 NOTE — Addendum Note (Signed)
Addendum  created 10/10/16 1230 by Achille Rich, MD   Sign clinical note

## 2016-11-27 ENCOUNTER — Other Ambulatory Visit: Payer: Self-pay | Admitting: Obstetrics & Gynecology

## 2016-11-27 DIAGNOSIS — Z1231 Encounter for screening mammogram for malignant neoplasm of breast: Secondary | ICD-10-CM

## 2016-12-11 ENCOUNTER — Ambulatory Visit: Payer: Managed Care, Other (non HMO)

## 2016-12-14 ENCOUNTER — Ambulatory Visit
Admission: RE | Admit: 2016-12-14 | Discharge: 2016-12-14 | Disposition: A | Discharge status: Home / Self Care | Payer: Managed Care, Other (non HMO) | Source: Ambulatory Visit | Attending: Obstetrics & Gynecology | Admitting: Obstetrics & Gynecology

## 2016-12-14 DIAGNOSIS — Z1231 Encounter for screening mammogram for malignant neoplasm of breast: Secondary | ICD-10-CM

## 2017-04-09 ENCOUNTER — Other Ambulatory Visit: Payer: Self-pay | Admitting: Advanced Practice Midwife

## 2017-04-09 DIAGNOSIS — N930 Postcoital and contact bleeding: Secondary | ICD-10-CM

## 2017-04-25 ENCOUNTER — Ambulatory Visit
Admission: RE | Admit: 2017-04-25 | Discharge: 2017-04-25 | Disposition: A | Discharge status: Home / Self Care | Payer: Managed Care, Other (non HMO) | Source: Ambulatory Visit | Attending: Advanced Practice Midwife | Admitting: Advanced Practice Midwife

## 2017-04-25 DIAGNOSIS — N930 Postcoital and contact bleeding: Secondary | ICD-10-CM

## 2017-12-11 ENCOUNTER — Other Ambulatory Visit: Payer: Self-pay | Admitting: Obstetrics & Gynecology

## 2017-12-11 DIAGNOSIS — Z1231 Encounter for screening mammogram for malignant neoplasm of breast: Secondary | ICD-10-CM

## 2018-01-29 ENCOUNTER — Ambulatory Visit
Admission: RE | Admit: 2018-01-29 | Discharge: 2018-01-29 | Disposition: A | Discharge status: Home / Self Care | Payer: BLUE CROSS/BLUE SHIELD | Source: Ambulatory Visit | Attending: Obstetrics & Gynecology | Admitting: Obstetrics & Gynecology

## 2018-01-29 ENCOUNTER — Encounter: Payer: Self-pay | Admitting: Radiology

## 2018-01-29 DIAGNOSIS — Z1231 Encounter for screening mammogram for malignant neoplasm of breast: Secondary | ICD-10-CM

## 2019-10-23 ENCOUNTER — Other Ambulatory Visit: Payer: Self-pay | Admitting: Obstetrics & Gynecology

## 2019-10-23 DIAGNOSIS — Z1231 Encounter for screening mammogram for malignant neoplasm of breast: Secondary | ICD-10-CM

## 2019-10-28 ENCOUNTER — Ambulatory Visit
Admission: RE | Admit: 2019-10-28 | Discharge: 2019-10-28 | Disposition: A | Discharge status: Home / Self Care | Payer: BLUE CROSS/BLUE SHIELD | Source: Ambulatory Visit | Attending: Obstetrics & Gynecology | Admitting: Obstetrics & Gynecology

## 2019-10-28 ENCOUNTER — Other Ambulatory Visit: Payer: Self-pay

## 2019-10-28 DIAGNOSIS — Z1231 Encounter for screening mammogram for malignant neoplasm of breast: Secondary | ICD-10-CM

## 2020-11-12 IMAGING — MG DIGITAL SCREENING BILAT W/ TOMO W/ CAD
8 series · 8 of 24 positions shown · non-contrast
Comparison: Previous exam(s).

CLINICAL DATA: Screening.

EXAM:
DIGITAL SCREENING BILATERAL MAMMOGRAM WITH TOMO AND CAD

[R CC synth-2D]
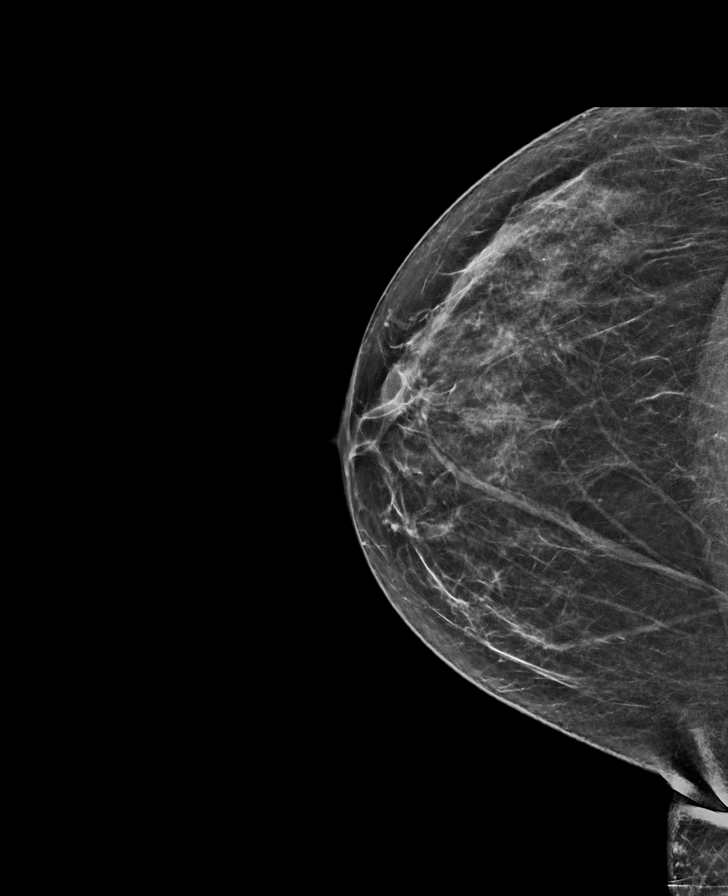

[L CC synth-2D]
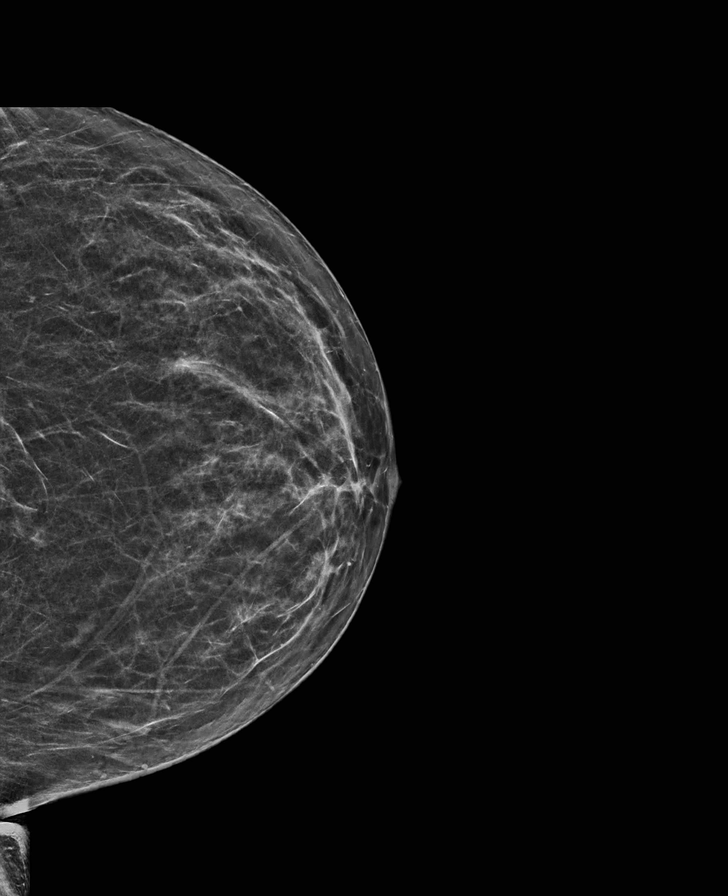

[R MLO synth-2D]
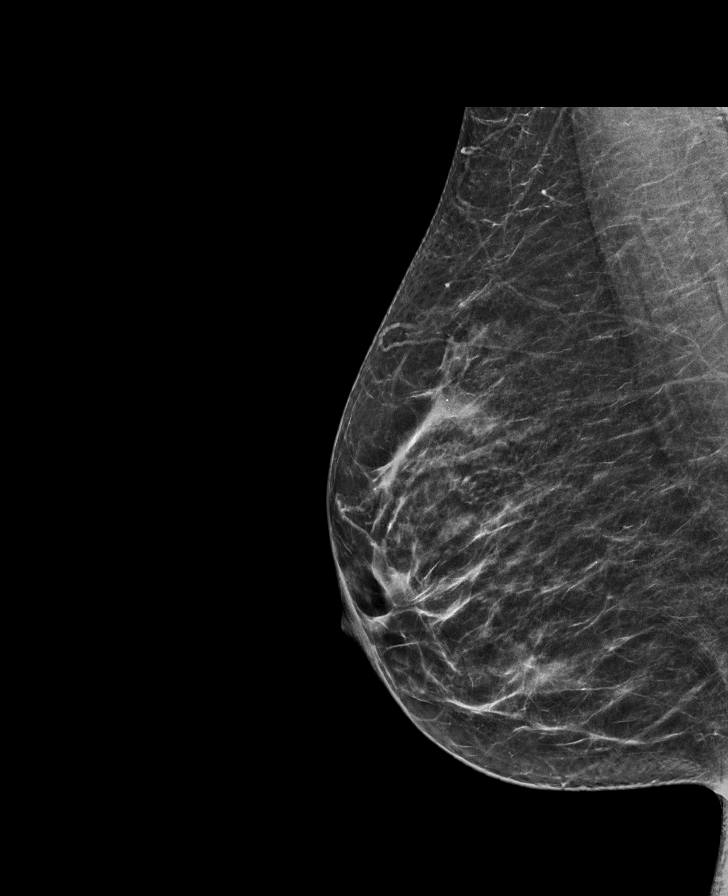

[L MLO synth-2D]
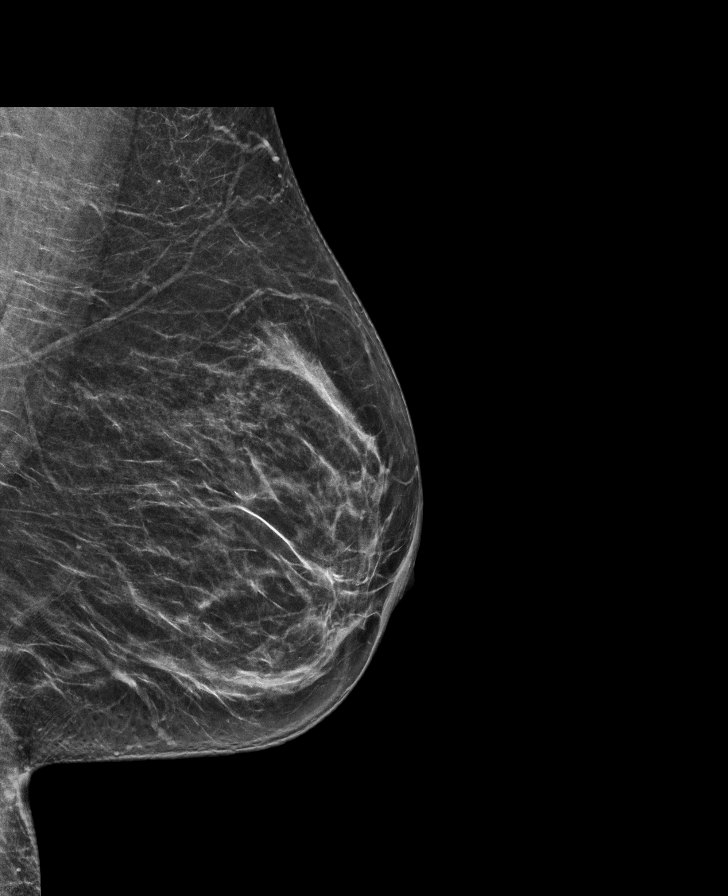

[L CC tomo · tomo slice 30/59.0]
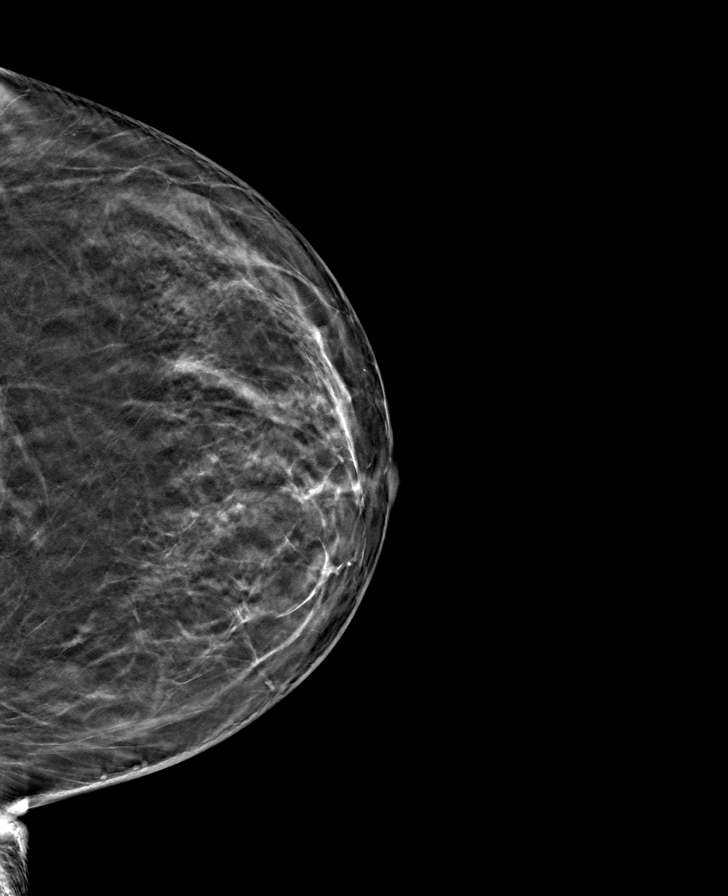

[L MLO tomo · tomo slice 31/62.0]
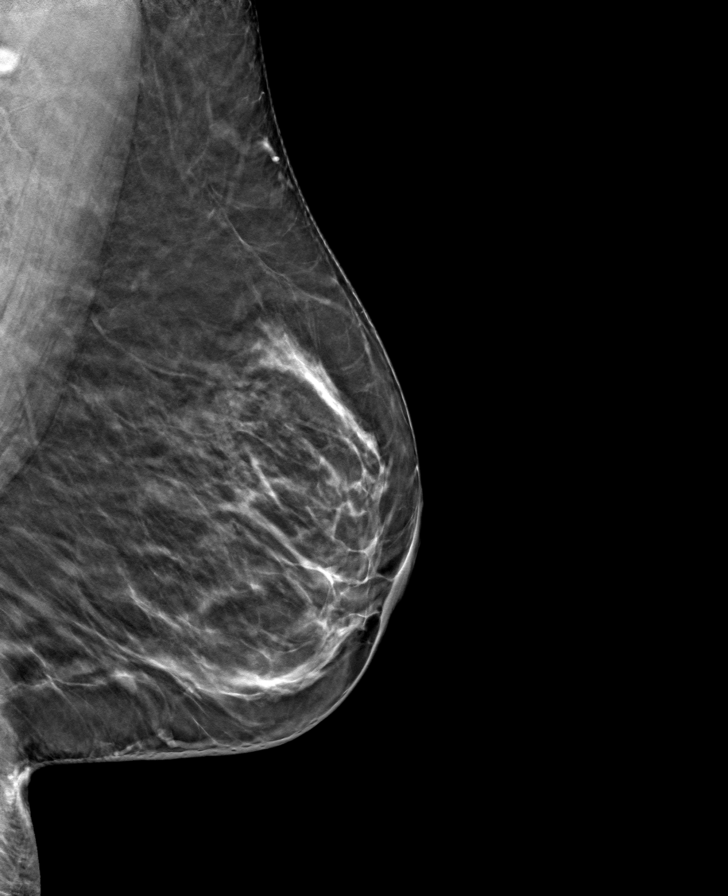

[R CC tomo · tomo slice 31/61.0]
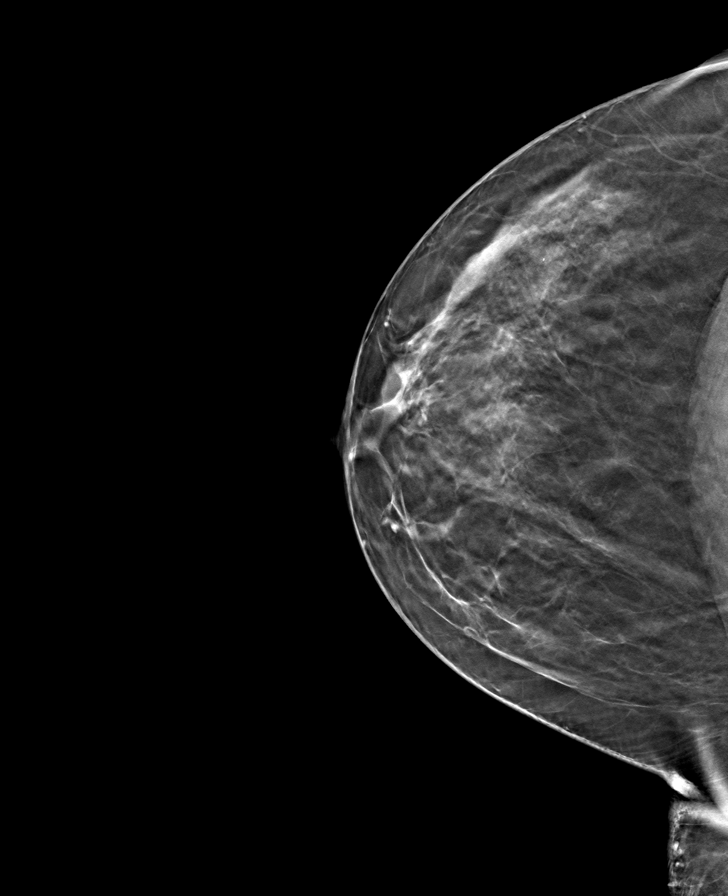

[R MLO tomo · tomo slice 31/62.0]
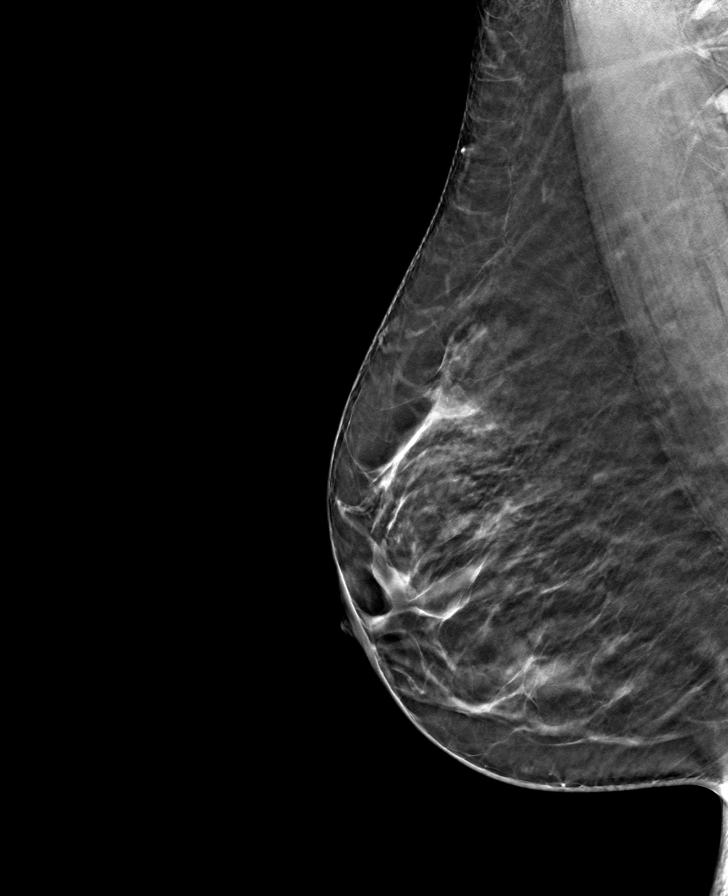

[8 of 24 positions shown; findings below may reference images not displayed]

ACR Breast Density Category b: There are scattered areas of
fibroglandular density.
FINDINGS: There are no findings suspicious for malignancy. Images were
processed with CAD.
IMPRESSION: No mammographic evidence of malignancy. A result letter of this
screening mammogram will be mailed directly to the patient.

RECOMMENDATION:
Screening mammogram in one year. (Code:[id number])

BI-RADS CATEGORY  1: Negative.
